# Patient Record
Sex: Male | Born: 1950 | Race: Black or African American | Hispanic: No | Marital: Married | Smoking: Current some day smoker
Health system: Southern US, Community
[De-identification: ages and names within clinical notes are randomized; demographics above are authoritative.]

## PROBLEM LIST (undated history)

## (undated) HISTORY — PX: OTHER SURGICAL HISTORY: SHX169

---

## 2005-03-14 ENCOUNTER — Emergency Department: Payer: Self-pay | Admitting: Emergency Medicine

## 2006-05-18 ENCOUNTER — Emergency Department: Payer: Self-pay | Admitting: Emergency Medicine

## 2009-09-03 ENCOUNTER — Emergency Department: Payer: Self-pay | Admitting: Internal Medicine

## 2019-12-25 ENCOUNTER — Emergency Department
Admission: EM | Admit: 2019-12-25 | Discharge: 2019-12-25 | Disposition: A | Discharge status: Home / Self Care | Payer: Medicare HMO | Attending: Emergency Medicine | Admitting: Emergency Medicine

## 2019-12-25 ENCOUNTER — Other Ambulatory Visit: Payer: Self-pay

## 2019-12-25 DIAGNOSIS — N390 Urinary tract infection, site not specified: Secondary | ICD-10-CM | POA: Diagnosis not present

## 2019-12-25 DIAGNOSIS — R300 Dysuria: Secondary | ICD-10-CM | POA: Diagnosis present

## 2019-12-25 DIAGNOSIS — R319 Hematuria, unspecified: Secondary | ICD-10-CM

## 2019-12-25 LAB — URINALYSIS, COMPLETE (UACMP) WITH MICROSCOPIC
Bacteria, UA: NONE SEEN
Bilirubin Urine: NEGATIVE
Glucose, UA: NEGATIVE mg/dL
Ketones, ur: NEGATIVE mg/dL
Nitrite: NEGATIVE
Protein, ur: >=300 mg/dL — AB
RBC / HPF: >50 RBC/hpf — ABNORMAL HIGH (ref 0–5)
Specific Gravity, Urine: 1.022 (ref 1.005–1.030)
Squamous Epithelial / LPF: NONE SEEN (ref 0–5)
WBC, UA: >50 WBC/hpf — ABNORMAL HIGH (ref 0–5)
pH: 6 (ref 5.0–8.0)

## 2019-12-25 MED ORDER — PHENAZOPYRIDINE HCL 200 MG PO TABS: 200.0000 mg | ORAL_TABLET | Freq: Three times a day (TID) | ORAL | 0 refills | Status: AC | PRN

## 2019-12-25 MED ORDER — CEPHALEXIN 500 MG PO CAPS: 500.0000 mg | ORAL_CAPSULE | Freq: Three times a day (TID) | ORAL | 0 refills | Status: DC

## 2019-12-25 MED ORDER — PHENAZOPYRIDINE HCL 200 MG PO TABS
200.0000 mg | ORAL_TABLET | Freq: Once | ORAL | Status: AC
  Administered 2019-12-25: 200 mg via ORAL
  Filled 2019-12-25: qty 1

## 2019-12-25 MED ORDER — CEPHALEXIN 500 MG PO CAPS
500.0000 mg | ORAL_CAPSULE | Freq: Once | ORAL | Status: AC
Start: 2019-12-25 — End: 2019-12-25
  Administered 2019-12-25: 500 mg via ORAL
  Filled 2019-12-25: qty 1

## 2019-12-25 NOTE — Discharge Instructions (Signed)
1.  Take antibiotic as prescribed (Keflex 500 mg 3 times daily x7 days). 2.  You may take medicine for the discomfort of urination as prescribed (Pyridium). 3.  Return to the ER for worsening symptoms, persistent vomiting, difficulty breathing or other concerns.

## 2019-12-25 NOTE — ED Provider Notes (Signed)
[facility] Emergency Department Provider Note   ____________________________________________   First MD Initiated Contact with Patient 12/25/19 [phone]     (approximate)  I have reviewed the triage vital signs and the nursing notes.   HISTORY  Chief Complaint burning with urination    HPI Edker Bergsten is a 69 y.o. male who presents to the ED from home with a chief complaint of dysuria.  Symptoms x2 days.  Denies associated fever, abdominal pain, nausea, vomiting, urethral discharge or STD concerns.        Past medical history None  There are no problems to display for this patient.    Prior to Admission medications   Medication Sig Start Date End Date Taking? Authorizing Provider  cephALEXin (KEFLEX) 500 MG capsule Take 1 capsule (500 mg total) by mouth 3 (three) times daily. 12/25/19   Paulette Blanch, MD  phenazopyridine (PYRIDIUM) 200 MG tablet Take 1 tablet (200 mg total) by mouth 3 (three) times daily as needed for pain. 12/25/19   Paulette Blanch, MD    Allergies Patient has no known allergies.  No family history on file.  Social History Social History   Tobacco Use  . Smoking status: Not on file  Substance Use Topics  . Alcohol use: Not on file  . Drug use: Not on file    Review of Systems  Constitutional: No fever/chills Eyes: No visual changes. ENT: No sore throat. Cardiovascular: Denies chest pain. Respiratory: Denies shortness of breath. Gastrointestinal: No abdominal pain.  No nausea, no vomiting.  No diarrhea.  No constipation. Genitourinary: Positive for dysuria. Musculoskeletal: Negative for back pain. Skin: Negative for rash. Neurological: Negative for headaches, focal weakness or numbness.   ____________________________________________   PHYSICAL EXAM:  VITAL SIGNS: ED Triage Vitals [12/25/19 0215]  Enc Vitals Group     BP (!) 190/97     Pulse Rate 100     Resp 16     Temp 97.9 F (36.6 C)     Temp Source  Oral     SpO2 100 %     Weight 200 lb (90.7 kg)     Height 5\' 8"  (1.727 m)     Head Circumference      Peak Flow      Pain Score 0     Pain Loc      Pain Edu?      Excl. in [place]?     Constitutional: Alert and oriented. Well appearing and in no acute distress. Eyes: Conjunctivae are normal. PERRL. EOMI. Head: Atraumatic. Nose: No congestion/rhinnorhea. Mouth/Throat: Mucous membranes are moist.   Neck: No stridor.   Cardiovascular: Normal rate, regular rhythm. Grossly normal heart sounds.  Good peripheral circulation. Respiratory: Normal respiratory effort.  No retractions. Lungs CTAB. Gastrointestinal: Soft and nontender to light or deep palpation. No distention. No abdominal bruits. No CVA tenderness. Musculoskeletal: No lower extremity tenderness nor edema.  No joint effusions. Neurologic:  Normal speech and language. No gross focal neurologic deficits are appreciated. No gait instability. Skin:  Skin is warm, dry and intact. No rash noted. Psychiatric: Mood and affect are normal. Speech and behavior are normal.  ____________________________________________   LABS (all labs ordered are listed, but only abnormal results are displayed)  Labs Reviewed  URINALYSIS, COMPLETE (UACMP) WITH MICROSCOPIC - Abnormal; Notable for the following components:      Result Value   Color, Urine YELLOW (*)    APPearance TURBID (*)    Hgb urine dipstick  MODERATE (*)    Protein, ur >=300 (*)    Leukocytes,Ua LARGE (*)    RBC / HPF >50 (*)    WBC, UA >50 (*)    All other components within normal limits  URINE CULTURE   ____________________________________________  EKG  None ____________________________________________  RADIOLOGY  ED MD interpretation: None  Official radiology report(s): No results found.  ____________________________________________   PROCEDURES  Procedure(s) performed (including Critical  Care):  Procedures   ____________________________________________   INITIAL IMPRESSION / ASSESSMENT AND PLAN / ED COURSE  As part of my medical decision making, I reviewed the following data within the [facility] notes reviewed and incorporated, Labs reviewed and Notes from prior ED visits     Sergi Pickron was evaluated in Emergency Department on 12/25/2019 for the symptoms described in the history of present illness. He was evaluated in the context of the global COVID-19 pandemic, which necessitated consideration that the patient might be at risk for infection with the SARS-CoV-2 virus that causes COVID-19. Institutional protocols and algorithms that pertain to the evaluation of patients at risk for COVID-19 are in a state of rapid change based on information released by regulatory bodies including the CDC and federal and state organizations. These policies and algorithms were followed during the patient's care in the ED.    69 year old male presenting with dysuria.  Urinalysis consistent with UTI.  Will culture urine, placed on Keflex and Pyridium and patient will follow up with his PCP.  Strict return precautions given.  Patient verbalizes understanding and agrees with plan of care.      ____________________________________________   FINAL CLINICAL IMPRESSION(S) / ED DIAGNOSES  Final diagnoses:  Urinary tract infection with hematuria, site unspecified     ED Discharge Orders         Ordered    cephALEXin (KEFLEX) 500 MG capsule  3 times daily     12/25/19 0401    phenazopyridine (PYRIDIUM) 200 MG tablet  3 times daily PRN     12/25/19 0401           Note:  This document was prepared using Dragon voice recognition software and may include unintentional dictation errors.   Paulette Blanch, MD 12/25/19 [phone]

## 2019-12-25 NOTE — ED Triage Notes (Signed)
Pt states two days of burning with urination. Pt states he believes he may have blood in urine. Pt denies fever, or other pain.

## 2019-12-27 LAB — URINE CULTURE: Culture: >=100000 — AB

## 2020-02-28 ENCOUNTER — Emergency Department: Payer: Medicare HMO

## 2020-02-28 ENCOUNTER — Other Ambulatory Visit: Payer: Self-pay

## 2020-02-28 ENCOUNTER — Emergency Department
Admission: EM | Admit: 2020-02-28 | Discharge: 2020-02-28 | Disposition: A | Discharge status: Home / Self Care | Payer: Medicare HMO | Attending: Emergency Medicine | Admitting: Emergency Medicine

## 2020-02-28 ENCOUNTER — Encounter: Payer: Self-pay | Admitting: Emergency Medicine

## 2020-02-28 DIAGNOSIS — M47816 Spondylosis without myelopathy or radiculopathy, lumbar region: Secondary | ICD-10-CM | POA: Insufficient documentation

## 2020-02-28 DIAGNOSIS — K5730 Diverticulosis of large intestine without perforation or abscess without bleeding: Secondary | ICD-10-CM | POA: Diagnosis not present

## 2020-02-28 DIAGNOSIS — R1032 Left lower quadrant pain: Secondary | ICD-10-CM | POA: Diagnosis present

## 2020-02-28 DIAGNOSIS — N410 Acute prostatitis: Secondary | ICD-10-CM

## 2020-02-28 DIAGNOSIS — K4020 Bilateral inguinal hernia, without obstruction or gangrene, not specified as recurrent: Secondary | ICD-10-CM | POA: Insufficient documentation

## 2020-02-28 DIAGNOSIS — I700 Atherosclerosis of aorta: Secondary | ICD-10-CM | POA: Insufficient documentation

## 2020-02-28 DIAGNOSIS — D3502 Benign neoplasm of left adrenal gland: Secondary | ICD-10-CM | POA: Diagnosis not present

## 2020-02-28 DIAGNOSIS — N50812 Left testicular pain: Secondary | ICD-10-CM

## 2020-02-28 DIAGNOSIS — F17210 Nicotine dependence, cigarettes, uncomplicated: Secondary | ICD-10-CM | POA: Insufficient documentation

## 2020-02-28 DIAGNOSIS — N400 Enlarged prostate without lower urinary tract symptoms: Secondary | ICD-10-CM | POA: Diagnosis not present

## 2020-02-28 DIAGNOSIS — N5089 Other specified disorders of the male genital organs: Secondary | ICD-10-CM

## 2020-02-28 LAB — COMPREHENSIVE METABOLIC PANEL
ALT: 16 U/L (ref 0–44)
AST: 17 U/L (ref 15–41)
Albumin: 3.6 g/dL (ref 3.5–5.0)
Alkaline Phosphatase: 88 U/L (ref 38–126)
Anion gap: 10 (ref 5–15)
BUN: 14 mg/dL (ref 8–23)
CO2: 25 mmol/L (ref 22–32)
Calcium: 8.7 mg/dL — ABNORMAL LOW (ref 8.9–10.3)
Chloride: 104 mmol/L (ref 98–111)
Creatinine, Ser: 0.87 mg/dL (ref 0.61–1.24)
GFR calc Af Amer: >60 mL/min (ref >60)
GFR calc non Af Amer: >60 mL/min (ref >60)
Glucose, Bld: 101 mg/dL — ABNORMAL HIGH (ref 70–99)
Potassium: 3.9 mmol/L (ref 3.5–5.1)
Sodium: 139 mmol/L (ref 135–145)
Total Bilirubin: 1.1 mg/dL (ref 0.3–1.2)
Total Protein: 7.5 g/dL (ref 6.5–8.1)

## 2020-02-28 LAB — URINALYSIS, COMPLETE (UACMP) WITH MICROSCOPIC
Bacteria, UA: NONE SEEN
Bilirubin Urine: NEGATIVE
Glucose, UA: NEGATIVE mg/dL
Ketones, ur: NEGATIVE mg/dL
Nitrite: NEGATIVE
Protein, ur: NEGATIVE mg/dL
Specific Gravity, Urine: 1.016 (ref 1.005–1.030)
WBC, UA: >50 WBC/hpf — ABNORMAL HIGH (ref 0–5)
pH: 5 (ref 5.0–8.0)

## 2020-02-28 LAB — CBC
HCT: 40 % (ref 39.0–52.0)
Hemoglobin: 14.1 g/dL (ref 13.0–17.0)
MCH: 30.9 pg (ref 26.0–34.0)
MCHC: 35.3 g/dL (ref 30.0–36.0)
MCV: 87.5 fL (ref 80.0–100.0)
Platelets: 292 10*3/uL (ref 150–400)
RBC: 4.57 MIL/uL (ref 4.22–5.81)
RDW: 15.9 % — ABNORMAL HIGH (ref 11.5–15.5)
WBC: 14 10*3/uL — ABNORMAL HIGH (ref 4.0–10.5)
nRBC: 0 % (ref 0.0–0.2)

## 2020-02-28 LAB — LIPASE, BLOOD: Lipase: 22 U/L (ref 11–51)

## 2020-02-28 MED ORDER — SULFAMETHOXAZOLE-TRIMETHOPRIM 800-160 MG PO TABS
1.0000 | ORAL_TABLET | Freq: Two times a day (BID) | ORAL | 0 refills | Status: DC
Start: 2020-02-28 — End: 2020-02-28

## 2020-02-28 MED ORDER — SULFAMETHOXAZOLE-TRIMETHOPRIM 800-160 MG PO TABS: 1.0000 | ORAL_TABLET | Freq: Two times a day (BID) | ORAL | 0 refills | Status: AC

## 2020-02-28 MED ORDER — SODIUM CHLORIDE 0.9% FLUSH: 3.0000 mL | Freq: Once | INTRAVENOUS | Status: DC

## 2020-02-28 NOTE — ED Provider Notes (Signed)
[facility] Emergency Department Provider Note  ____________________________________________  Time seen: Approximately 12:21 PM  I have reviewed the triage vital signs and the nursing notes.   HISTORY  Chief Complaint Abdominal Pain    HPI Mark Olliff. is a 69 y.o. male with no significant past medical history who comes ED complaining of left lower quadrant abdominal pain radiating to the left testicle for the past 3 days.  Constant, waxing and waning, no aggravating or alleviating factors.  Denies dysuria frequency urgency hematuria or penile discharge.  Denies sexual activity or possibility of STI.  No vomiting or diarrhea.  No history of kidney stones.  Denies fever.      History reviewed. No pertinent past medical history.   There are no problems to display for this patient.    History reviewed. No pertinent surgical history.   Prior to Admission medications   Medication Sig Start Date End Date Taking? Authorizing Provider  cephALEXin (KEFLEX) 500 MG capsule Take 1 capsule (500 mg total) by mouth 3 (three) times daily. 12/25/19   Paulette Blanch, MD  phenazopyridine (PYRIDIUM) 200 MG tablet Take 1 tablet (200 mg total) by mouth 3 (three) times daily as needed for pain. 12/25/19   Paulette Blanch, MD  sulfamethoxazole-trimethoprim (BACTRIM DS) 800-160 MG tablet Take 1 tablet by mouth 2 (two) times daily for 14 days. 02/28/20 03/13/20  Carrie Mew, MD     Allergies Patient has no known allergies.   History reviewed. No pertinent family history.  Social History Social History   Tobacco Use  . Smoking status: Current Some Day Smoker  . Smokeless tobacco: Never Used  Substance Use Topics  . Alcohol use: Not on file  . Drug use: Not on file    Review of Systems  Constitutional:   No fever or chills.  ENT:   No sore throat. No rhinorrhea. Cardiovascular:   No chest pain or syncope. Respiratory:   No dyspnea or cough. Gastrointestinal:    Positive left lower quadrant pain as above without vomiting and diarrhea.  Musculoskeletal:   Negative for focal pain or swelling All other systems reviewed and are negative except as documented above in ROS and HPI.  ____________________________________________   PHYSICAL EXAM:  VITAL SIGNS: ED Triage Vitals  Enc Vitals Group     BP 02/28/20 0754 (!) 175/95     Pulse Rate 02/28/20 0754 92     Resp 02/28/20 0754 18     Temp 02/28/20 0754 97.6 F (36.4 C)     Temp Source 02/28/20 0754 Oral     SpO2 02/28/20 0754 96 %     Weight 02/28/20 0755 240 lb (108.9 kg)     Height 02/28/20 0755 5\' 8"  (1.727 m)     Head Circumference --      Peak Flow --      Pain Score 02/28/20 0754 6     Pain Loc --      Pain Edu? --      Excl. in [place]? --     Vital signs reviewed, nursing assessments reviewed.   Constitutional:   Alert and oriented. Non-toxic appearance. Eyes:   Conjunctivae are normal. EOMI. PERRL. ENT      Head:   Normocephalic and atraumatic.      Nose:   Wearing a mask.      Mouth/Throat:   Wearing a mask.      Neck:   No meningismus. Full ROM. Hematological/Lymphatic/Immunilogical:   No  cervical lymphadenopathy. Cardiovascular:   RRR. Symmetric bilateral radial and DP pulses.  No murmurs. Cap refill less than 2 seconds. Respiratory:   Normal respiratory effort without tachypnea/retractions. Breath sounds are clear and equal bilaterally. No wheezes/rales/rhonchi. Gastrointestinal:   Soft and nontender. Non distended. There is no CVA tenderness.  No rebound, rigidity, or guarding. Genitourinary:   Left scrotal swelling erythema and tenderness.  No crepitus.  No hernia Musculoskeletal:   Normal range of motion in all extremities. No joint effusions.  No lower extremity tenderness.  No edema. Neurologic:   Normal speech and language.  Motor grossly intact. No acute focal neurologic deficits are appreciated.  Skin:    Skin is warm, dry and intact. No rash noted.  No petechiae,  purpura, or bullae.  ____________________________________________    LABS (pertinent positives/negatives) (all labs ordered are listed, but only abnormal results are displayed) Labs Reviewed  COMPREHENSIVE METABOLIC PANEL - Abnormal; Notable for the following components:      Result Value   Glucose, Bld 101 (*)    Calcium 8.7 (*)    All other components within normal limits  CBC - Abnormal; Notable for the following components:   WBC 14.0 (*)    RDW 15.9 (*)    All other components within normal limits  URINALYSIS, COMPLETE (UACMP) WITH MICROSCOPIC - Abnormal; Notable for the following components:   Color, Urine YELLOW (*)    APPearance CLEAR (*)    Hgb urine dipstick SMALL (*)    Leukocytes,Ua LARGE (*)    WBC, UA >50 (*)    All other components within normal limits  LIPASE, BLOOD   ____________________________________________   EKG    ____________________________________________    RADIOLOGY  CT Renal Stone Study  Result Date: 02/28/2020 CLINICAL DATA:  Flank pain. Suspect kidney stone. LEFT LOWER QUADRANT pain radiates to LEFT groin. Decreased urination for 2 days. No previous studies. EXAM: CT ABDOMEN AND PELVIS WITHOUT CONTRAST TECHNIQUE: Multidetector CT imaging of the abdomen and pelvis was performed following the standard protocol without IV contrast. COMPARISON:  None. FINDINGS: Lower chest: There is focal airspace filling opacity at the MEDIAL RIGHT lung base, consistent minimal infiltrate. LEFT lung base is unremarkable. There is elevation of LEFT hemidiaphragm. Heart size is normal. Hepatobiliary: No focal liver abnormality is seen. No radiopaque gallstones, biliary dilatation, or pericholecystic inflammatory changes. Pancreas: Unremarkable. No pancreatic ductal dilatation or surrounding inflammatory changes. Spleen: Normal in size without focal abnormality. Adrenals/Urinary Tract: A LEFT adrenal adenoma is 2.5 x 2.6 centimeters. RIGHT adrenal gland is normal in  appearance. Kidneys are unremarkable. Ureters are unremarkable. The bladder and visualized portion of the urethra are normal. Stomach/Bowel: The stomach is unremarkable. Small bowel loops are normal in caliber and wall thickness. The appendix is well seen and has a normal appearance. Moderate stool burden. There is significant diverticulosis of the descending and sigmoid colon. No acute inflammatory changes. Vascular/Lymphatic: There is focal atherosclerotic calcification of the abdominal aorta, not associated with aneurysm. No retroperitoneal or mesenteric adenopathy. Reproductive: The prostate gland appears enlarged. Inflammatory changes are identified in the fat surrounding the prostate and seminal vesicles. No evidence for fluid collection or contour deforming mass. Findings are favored to represent acute prostatitis. Other: Bilateral fat containing inguinal hernias are present. Anterior abdominal wall is unremarkable. No ascites. Musculoskeletal: Extensive degenerative changes in the LOWER lumbar spine. Coarse trabeculae and increased sclerosis identified in the LEFT iliac wing and LEFT pubis. There are degenerative changes in the LEFT hip. IMPRESSION: 1. Enlarged  prostate gland with inflammatory changes in the fat surrounding the prostate and seminal vesicles. Findings are favored to represent prostatitis. No evidence for fluid collection or contour deforming mass. 2. No urinary tract stones. 3. LEFT adrenal adenoma. 4. Colonic diverticulosis without acute inflammatory changes. 5. Bilateral fat containing inguinal hernias. 6. Degenerative changes in the LOWER lumbar spine and LEFT hip. 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 7. Aortic Atherosclerosis (ICD10-I70.0). Electronically Signed   By: Nolon Nations M.D.   On: 02/28/2020 14:52   US SCROTUM W/DOPPLER  Result Date: 02/28/2020 CLINICAL DATA:  Left scrotal edema and tenderness for 3 days EXAM: SCROTAL ULTRASOUND DOPPLER ULTRASOUND OF THE TESTICLES  TECHNIQUE: Complete ultrasound examination of the testicles, epididymis, and other scrotal structures was performed. Color and spectral Doppler ultrasound were also utilized to evaluate blood flow to the testicles. COMPARISON:  None. FINDINGS: Right testicle Measurements: 3.6 x 2.3 x 2.5 cm. No mass or microlithiasis visualized. Left testicle Measurements:  3.6 x 2.1 cm.  No mass or microlithiasis visualized. Right epididymis:  There is a 0.9 cm cyst in the right epididymis. Left epididymis:  Normal in size and appearance. Hydrocele:  Small bilateral hydroceles are noted. Varicocele:  None visualized. Pulsed Doppler interrogation of both testes demonstrates normal low resistance arterial and venous waveforms bilaterally. IMPRESSION: No evidence of testicular torsion. No evidence of orchitis. Small bilateral hydroceles. Electronically Signed   By: Abelardo Diesel M.D.   On: 02/28/2020 13:18    ____________________________________________   PROCEDURES Procedures  ____________________________________________  DIFFERENTIAL DIAGNOSIS   Diverticulitis, kidney stone, scrotal cellulitis, epididymitis  CLINICAL IMPRESSION / ASSESSMENT AND PLAN / ED COURSE  Medications ordered in the ED: Medications  sodium chloride flush (NS) 0.9 % injection 3 mL (has no administration in time range)    Pertinent labs & imaging results that were available during my care of the patient were reviewed by me and considered in my medical decision making (see chart for details).  Mark Shawn. was evaluated in Emergency Department on 02/28/2020 for the symptoms described in the history of present illness. He was evaluated in the context of the global COVID-19 pandemic, which necessitated consideration that the patient might be at risk for infection with the SARS-CoV-2 virus that causes COVID-19. Institutional protocols and algorithms that pertain to the evaluation of patients at risk for COVID-19 are in a state of rapid  change based on information released by regulatory bodies including the CDC and federal and state organizations. These policies and algorithms were followed during the patient's care in the ED.   Patient presents with left lower quadrant pain, scrotal swelling.  Doubt torsion.  Most likely epididymitis or prostatitis.  UA does show large leukoesterase and WBCs, no bacteria.  Will obtain scrotal ultrasound.  Plan to start antibiotics.   ----------------------------------------- 3:19 PM on 02/28/2020 -----------------------------------------  Scrotal ultrasound unremarkable.  CT renal stone protocol obtained which shows prostatitis, no obstructing stone or other intra-abdominal pathology.  Stable for discharge home on Bactrim, follow-up with urology.     ____________________________________________   FINAL CLINICAL IMPRESSION(S) / ED DIAGNOSES    Final diagnoses:  Acute prostatitis     ED Discharge Orders         Ordered    sulfamethoxazole-trimethoprim (BACTRIM DS) 800-160 MG tablet  2 times daily,   Status:  Discontinued     Reprint     02/28/20 1518    sulfamethoxazole-trimethoprim (BACTRIM DS) 800-160 MG tablet  2 times daily     Discontinue  Reprint     02/28/20 1519          Portions of this note were generated with dragon dictation software. Dictation errors may occur despite best attempts at proofreading.   Carrie Mew, MD 02/28/20 [phone]

## 2020-02-28 NOTE — ED Notes (Signed)
E-signature not working at this time. Pt verbalized understanding of D/C instructions, prescriptions and follow up care with no further questions at this time. Pt in NAD and ambulatory at time of D/C.  

## 2020-02-28 NOTE — ED Triage Notes (Signed)
PT to ER with c/o LLQ abdominal pain for last 3 days.  PT denies n/v/d, states he helped a family member moved and pain started several days after that.  States he feels like it is swollen in the area of pain.  No urinary symptoms.

## 2020-03-06 NOTE — Progress Notes (Signed)
03/07/2020 3:15 PM   Mark Snyder. 01/07/1951 [id number]  Referring provider: No referring provider defined for this encounter. Chief Complaint  Patient presents with  . New Patient (Initial Visit)    ER follow up    HPI: Mark Snyder. is a 69 y.o. male who is seen today for an evaluation and management of acute prostatitis and non obstructing stone.  He was seen in the ED in 12/2019 and diagnosed with a UTI. UA showed moderate hematuria, WBC >50, RBC >50. Associated urine culture indicative of E. Coli resistant to ampicillin and intermediate to ampicillin/sulbactam.  He was presented to the ED with abdominal pain on 02/28/2020. He had left lower quadrant abdominal pain radiating to the left testicle x 3 days. Pain was constant, waxing and waning, no aggravating or alleviating factors.  Denied dysuria frequency urgency hematuria or penile discharge. No vomiting or diarrhea.  No history of kidney stones. Patient discharged on bactrim.   UA showed microscopic hematuria, WBC >50, RBC 6-10.   Scrotum ultrasound showed no evidence of testicular torsion. No evidence of orchitis. Small bilateral hydroceles.  CT renal stone study showed enlarged prostate gland with inflammatory changes in the fat surrounding the prostate and seminal vesicles. Findings were favored to represent prostatitis. No evidence for fluid collection or contour deforming mass. No urinary tract stones. LEFT adrenal adenoma.  PVR is 17 mL.   Overall, he reports today that he seems to be improving.  His urinary symptoms are abating but have not fully resolved.Marland Kitchen  His testicular pain is resolved.  He has no hematuria. Denies dysuria.       He has no primary care physician and has "no" medical problems.  Blood pressure today is 188/73.    IPSS    Row Name 03/07/20 1500         International Prostate Symptom Score   How often have you had the sensation of not emptying your bladder? Almost always     How often  have you had to urinate less than every two hours? More than half the time     How often have you found you stopped and started again several times when you urinated? Not at All     How often have you found it difficult to postpone urination? About half the time     How often have you had a weak urinary stream? Not at All     How often have you had to strain to start urination? Not at All     How many times did you typically get up at night to urinate? 3 Times     Total IPSS Score 15       Quality of Life due to urinary symptoms   If you were to spend the rest of your life with your urinary condition just the way it is now how would you feel about that? Mostly Satisfied            Score: 15 1-7 Mild 8-19 Moderate 20-35 Severe     PMH: History reviewed. No pertinent past medical history.  Surgical History: Past Surgical History:  Procedure Laterality Date  . none      Home Medications:  Allergies as of 03/07/2020   No Known Allergies     Medication List       Accurate as of March 07, 2020 11:59 PM. If you have any questions, ask your nurse or doctor.        STOP  taking these medications   cephALEXin 500 MG capsule Commonly known as: KEFLEX Stopped by: Hollice Espy, MD     TAKE these medications   phenazopyridine 200 MG tablet Commonly known as: Pyridium Take 1 tablet (200 mg total) by mouth 3 (three) times daily as needed for pain.   sulfamethoxazole-trimethoprim 800-160 MG tablet Commonly known as: Bactrim DS Take 1 tablet by mouth 2 (two) times daily for 14 days.       Allergies: No Known Allergies  Family History: Family History  Problem Relation Age of Onset  . Bladder Cancer Neg Hx   . Kidney cancer Neg Hx   . Prostate cancer Neg Hx     Social History:  reports that he has been smoking. He has never used smokeless tobacco. No history on file for alcohol use and drug use. Lives in [place], [place].    Physical Exam: BP (!) 188/73   Pulse  76   Ht 5\' 8"  (1.727 m)   Wt 216 lb (98 kg)   BMI 32.84 kg/m   Constitutional:  Alert and oriented, No acute distress. HEENT: [place] AT, moist mucus membranes.  Trachea midline, no masses. Cardiovascular: No clubbing, cyanosis, or edema. Respiratory: Normal respiratory effort, no increased work of breathing. Skin: No rashes, bruises or suspicious lesions. Neurologic: Grossly intact, no focal deficits, moving all 4 extremities. Psychiatric: Normal mood and affect.  Laboratory Data:  Lab Results  Component Value Date   CREATININE 0.87 02/28/2020    Urinalysis normal  Pertinent Imaging: Results for orders placed or performed in visit on 03/07/20  Microscopic Examination   Urine  Result Value Ref Range   WBC, UA 0-5 0 - 5 /hpf   RBC 0-2 0 - 2 /hpf   Epithelial Cells (non renal) 0-10 0 - 10 /hpf   Bacteria, UA None seen None seen/Few  Urinalysis, Complete  Result Value Ref Range   Specific Gravity, UA 1.025 1.005 - 1.030   pH, UA 5.0 5.0 - 7.5   Color, UA Yellow Yellow   Appearance Ur Clear Clear   Leukocytes,UA Trace (A) Negative   Protein,UA Negative Negative/Trace   Glucose, UA Negative Negative   Ketones, UA Negative Negative   RBC, UA Negative Negative   Bilirubin, UA Negative Negative   Urobilinogen, Ur 0.2 0.2 - 1.0 mg/dL   Nitrite, UA Negative Negative   Microscopic Examination See below:   BLADDER SCAN AMB NON-IMAGING  Result Value Ref Range   Scan Result 31ml      Results for orders placed during the hospital encounter of 02/28/20  CT Renal Stone Study  Narrative CLINICAL DATA:  Flank pain. Suspect kidney stone. LEFT LOWER QUADRANT pain radiates to LEFT groin. Decreased urination for 2 days. No previous studies.  EXAM: CT ABDOMEN AND PELVIS WITHOUT CONTRAST  TECHNIQUE: Multidetector CT imaging of the abdomen and pelvis was performed following the standard protocol without IV contrast.  COMPARISON:  None.  FINDINGS: Lower chest: There is focal  airspace filling opacity at the MEDIAL RIGHT lung base, consistent minimal infiltrate. LEFT lung base is unremarkable. There is elevation of LEFT hemidiaphragm. Heart size is normal.  Hepatobiliary: No focal liver abnormality is seen. No radiopaque gallstones, biliary dilatation, or pericholecystic inflammatory changes.  Pancreas: Unremarkable. No pancreatic ductal dilatation or surrounding inflammatory changes.  Spleen: Normal in size without focal abnormality.  Adrenals/Urinary Tract: A LEFT adrenal adenoma is 2.5 x 2.6 centimeters. RIGHT adrenal gland is normal in appearance. Kidneys are unremarkable. Ureters are unremarkable.  The bladder and visualized portion of the urethra are normal.  Stomach/Bowel: The stomach is unremarkable. Small bowel loops are normal in caliber and wall thickness. The appendix is well seen and has a normal appearance. Moderate stool burden. There is significant diverticulosis of the descending and sigmoid colon. No acute inflammatory changes.  Vascular/Lymphatic: There is focal atherosclerotic calcification of the abdominal aorta, not associated with aneurysm. No retroperitoneal or mesenteric adenopathy.  Reproductive: The prostate gland appears enlarged. Inflammatory changes are identified in the fat surrounding the prostate and seminal vesicles. No evidence for fluid collection or contour deforming mass. Findings are favored to represent acute prostatitis.  Other: Bilateral fat containing inguinal hernias are present. Anterior abdominal wall is unremarkable. No ascites.  Musculoskeletal: Extensive degenerative changes in the LOWER lumbar spine. Coarse trabeculae and increased sclerosis identified in the LEFT iliac wing and LEFT pubis. There are degenerative changes in the LEFT hip.  IMPRESSION: 1. Enlarged prostate gland with inflammatory changes in the fat surrounding the prostate and seminal vesicles. Findings are favored to represent  prostatitis. No evidence for fluid collection or contour deforming mass. 2. No urinary tract stones. 3. LEFT adrenal adenoma. 4. Colonic diverticulosis without acute inflammatory changes. 5. Bilateral fat containing inguinal hernias. 6. Degenerative changes in the LOWER lumbar spine and LEFT hip. 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 7. Aortic Atherosclerosis (ICD10-I70.0).   Electronically Signed By: Nolon Nations M.D. On: 02/28/2020 14:52   I have personally reviewed the images and agree with radiologist interpretation.    Assessment & Plan:    1. Left adrenal adenoma  [facility] imaging show stable in size since 2005. Will defer from functional work up. Suspect benign etiology given lack of change   2. Acute prostatitis/recurrent UTIs Patient has had several infections over the last few months.  UA was normal, will recheck after symptoms improve.  Discussed prostate cancer screening   Patient is emptying bladder well today  Continue complete antibiotics  Plan for symptom recheck in 3 months after infection is resolved, may have underlying BPH issues given to serial infections     Due to lack of PCP health screenings, I encouraged the patient to have a PCP and helped refer him to a new provider at [facility] [place], [place].    Follow up in 3 months for DRE, PSA, IPSS, PVR  Return in about 3 months (around 06/07/2020) for 51mo w/PSA, DRE, PVR IPSS.  [facility] [address], [place], [place] [address] [phone]  I, Selena Batten, am acting as a scribe for Dr. Hollice Espy.  I have reviewed the above documentation for accuracy and completeness, and I agree with the above.   Hollice Espy, MD  I spent 45 total minutes on the day of the encounter including pre-visit review of the medical record, face-to-face time with the patient, and post visit ordering of labs/imaging/tests.

## 2020-03-07 ENCOUNTER — Encounter: Payer: Self-pay | Admitting: Urology

## 2020-03-07 ENCOUNTER — Ambulatory Visit (INDEPENDENT_AMBULATORY_CARE_PROVIDER_SITE_OTHER): Payer: Medicare HMO | Admitting: Urology

## 2020-03-07 ENCOUNTER — Other Ambulatory Visit: Payer: Self-pay

## 2020-03-07 VITALS — BP 188/73 | HR 76 | Ht 68.0 in | Wt 216.0 lb

## 2020-03-07 DIAGNOSIS — N410 Acute prostatitis: Secondary | ICD-10-CM | POA: Diagnosis not present

## 2020-03-07 DIAGNOSIS — D3502 Benign neoplasm of left adrenal gland: Secondary | ICD-10-CM | POA: Diagnosis not present

## 2020-03-07 LAB — BLADDER SCAN AMB NON-IMAGING

## 2020-03-07 NOTE — Patient Instructions (Signed)
Prostate Cancer  The prostate is a male gland that helps make semen. Prostate cancer is when abnormal cells grow in this gland. Follow these instructions at home:  Take over-the-counter and prescription medicines only as told by your doctor.  Eat a healthy diet.  Get plenty of sleep.  Ask your doctor for help to find a support group for men with prostate cancer.  Keep all follow-up visits as told by your doctor. This is important.  If you have to go to the hospital, let your cancer doctor (oncologist) know.  Touch, hold, hug, and caress your partner to continue to show sexual feelings. Contact a doctor if:  You have trouble peeing (urinating).  You have blood in your pee (urine).  You have pain in your hips, back, or chest. Get help right away if:  You have weakness in your legs.  You lose feeling (have numbness) in your legs.  You cannot control your pee or your poop (stool).  You have trouble breathing.  You have sudden pain in your chest.  You have chills or a fever. Summary  The prostate is a male gland that helps make semen. Prostate cancer is when abnormal cells grow in this gland.  Ask your doctor for help to find a support group for men with prostate cancer.  Contact a doctor if you have problems peeing or have any new pain that you did not have before. This information is not intended to replace advice given to you by your health care provider. Make sure you discuss any questions you have with your health care provider. Document Revised: 07/25/2017 Document Reviewed: 04/22/2016 Elsevier Patient Education  2020 Elsevier Inc.  

## 2020-03-08 LAB — MICROSCOPIC EXAMINATION: Bacteria, UA: NONE SEEN

## 2020-03-08 LAB — URINALYSIS, COMPLETE
Bilirubin, UA: NEGATIVE
Glucose, UA: NEGATIVE
Ketones, UA: NEGATIVE
Nitrite, UA: NEGATIVE
Protein,UA: NEGATIVE
RBC, UA: NEGATIVE
Specific Gravity, UA: 1.025 (ref 1.005–1.030)
Urobilinogen, Ur: 0.2 mg/dL (ref 0.2–1.0)
pH, UA: 5 (ref 5.0–7.5)

## 2020-06-06 ENCOUNTER — Ambulatory Visit: Payer: Medicare HMO | Admitting: Physician Assistant

## 2022-03-12 IMAGING — CT CT RENAL STONE PROTOCOL
2 of 4 series · 15 of 46 positions shown, 17 images · non-contrast
Comparison: None.

CLINICAL DATA: Flank pain. Suspect kidney stone. LEFT LOWER
QUADRANT pain radiates to LEFT groin. Decreased urination for 2
days. No previous studies.

EXAM:
CT ABDOMEN AND PELVIS WITHOUT CONTRAST
TECHNIQUE: Multidetector CT imaging of the abdomen and pelvis was performed
following the standard protocol without IV contrast.

[Series 2: stone full standard · axial · 0.77mm/px · z∈[-877,-447]mm · 12 of 96 slices shown, 14 images]
[im 5/96  soft-tissue]
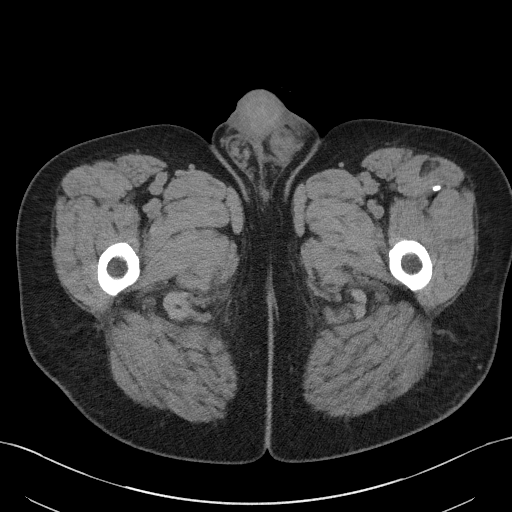
[im 5/96  bone]
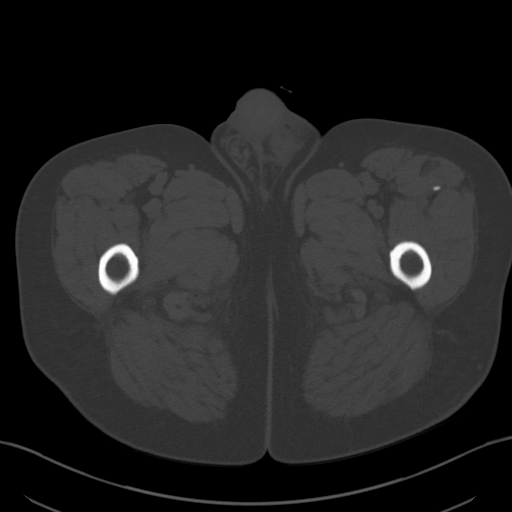
[im 13/96  soft-tissue]
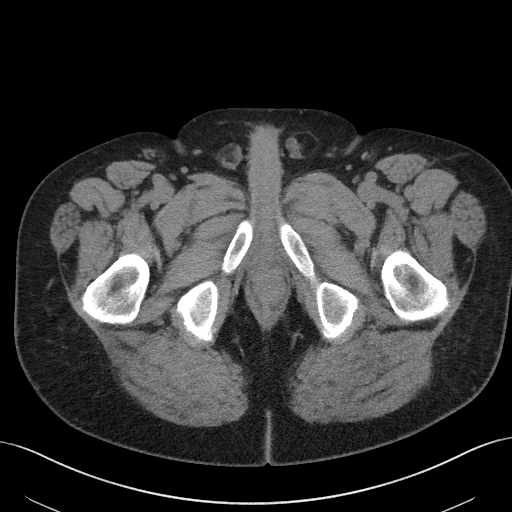
[im 22/96  soft-tissue]
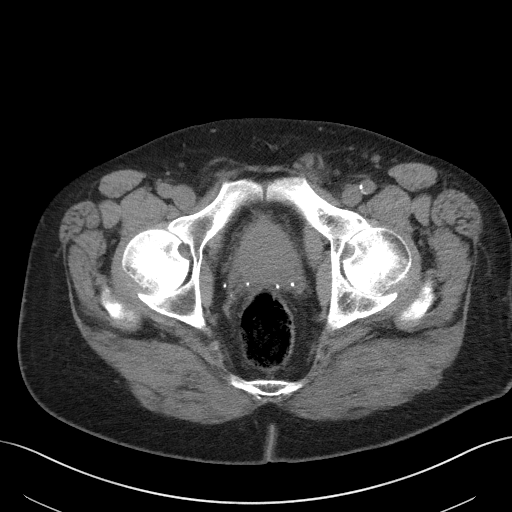
[im 31/96  soft-tissue]
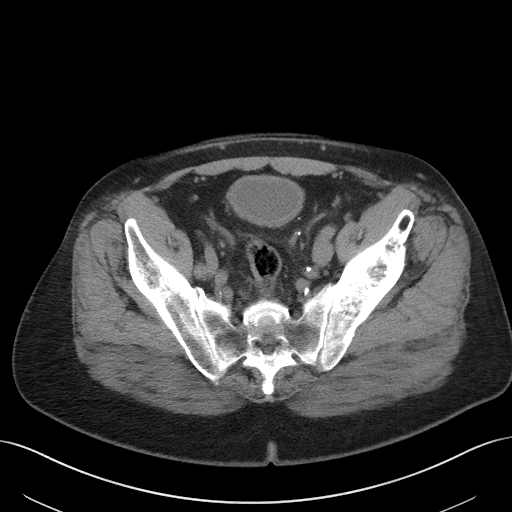
[im 35/96  soft-tissue]
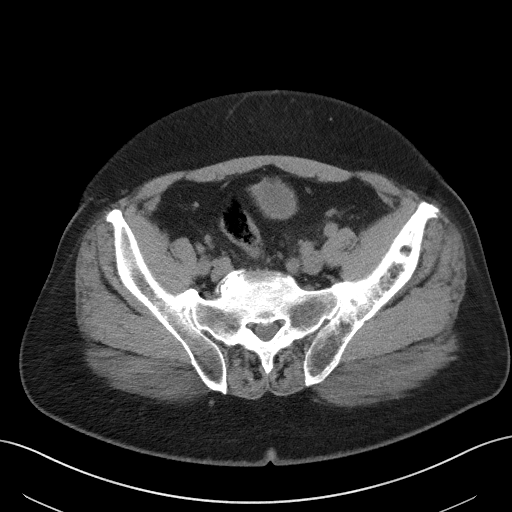
[im 44/96  soft-tissue]
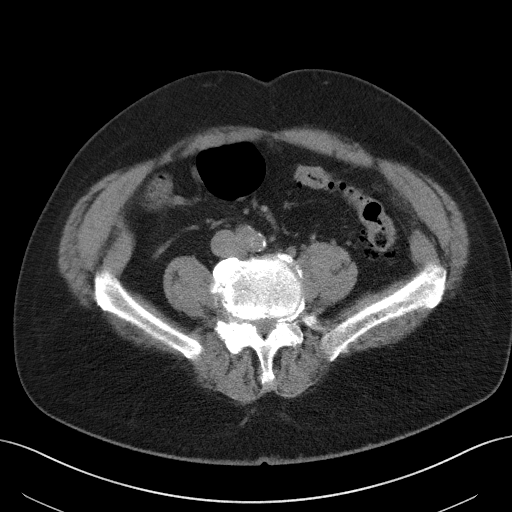
[im 52/96  soft-tissue]
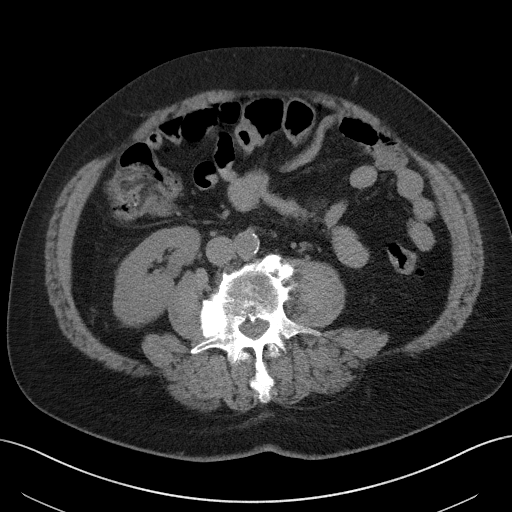
[im 61/96  soft-tissue]
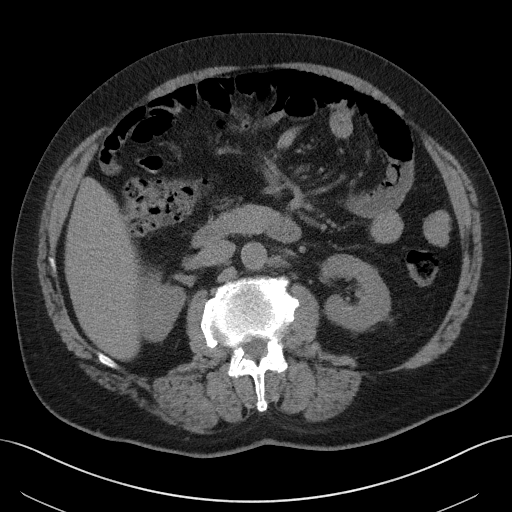
[im 65/96  soft-tissue]
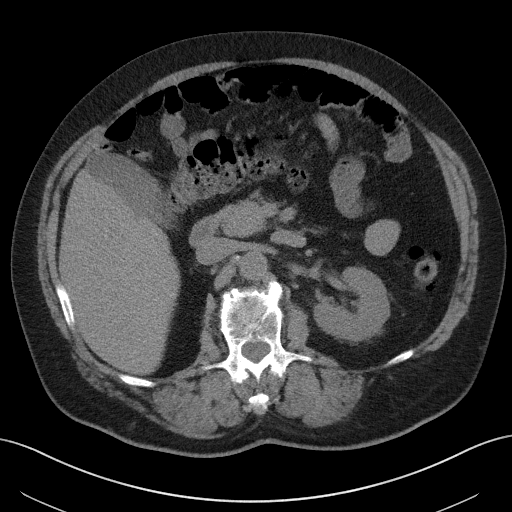
[im 65/96  bone]
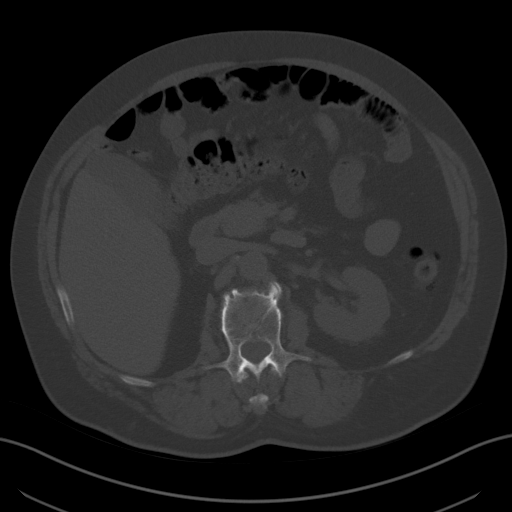
[im 74/96  soft-tissue]
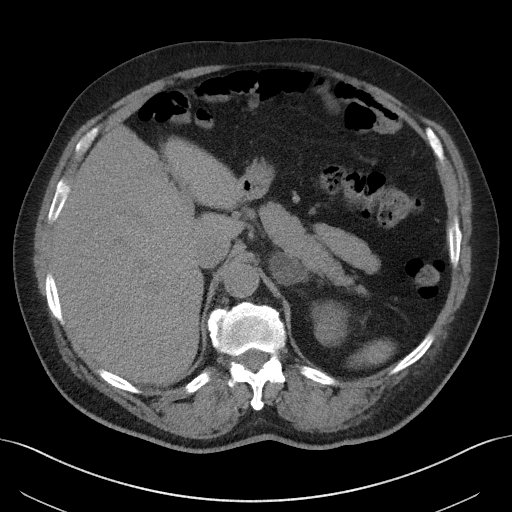
[im 83/96  soft-tissue]
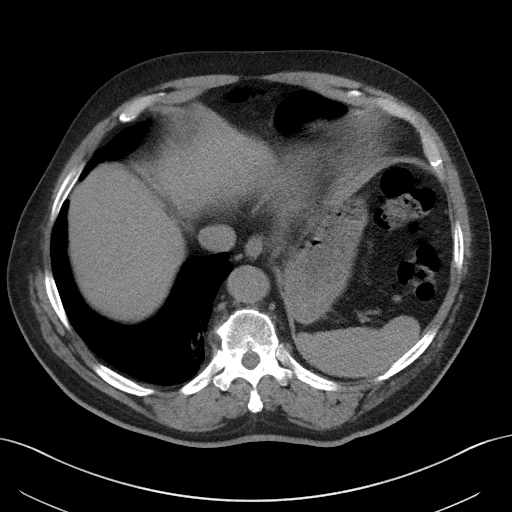
[im 91/96  soft-tissue]
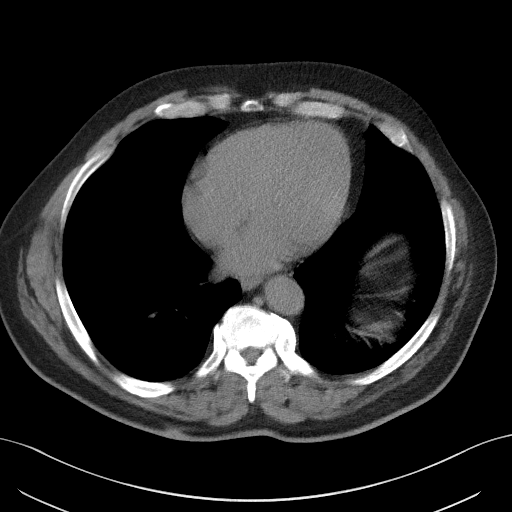

[Series 6: coronal · coronal · 0.74mm/px · 3 of 170 slices shown]
[im 57/170  soft-tissue]
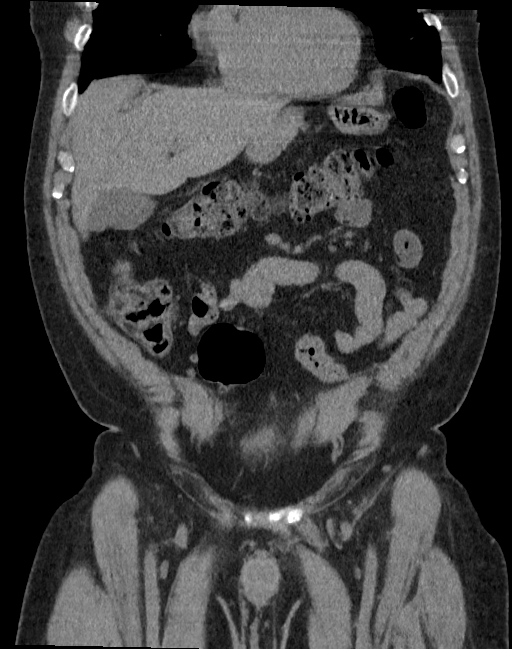
[im 76/170  soft-tissue]
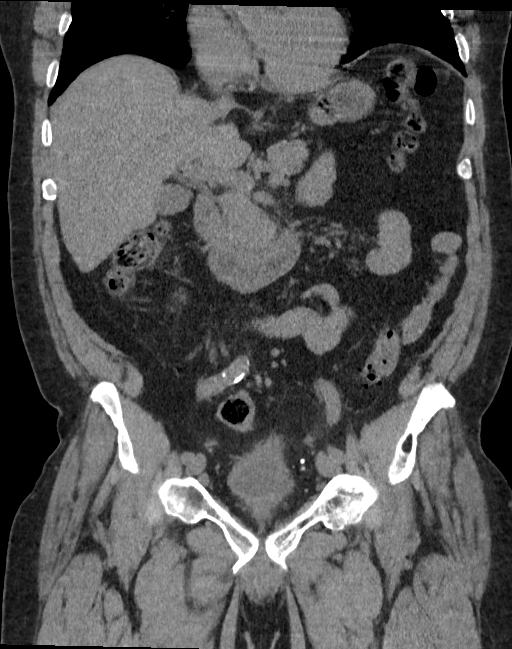
[im 94/170  soft-tissue]
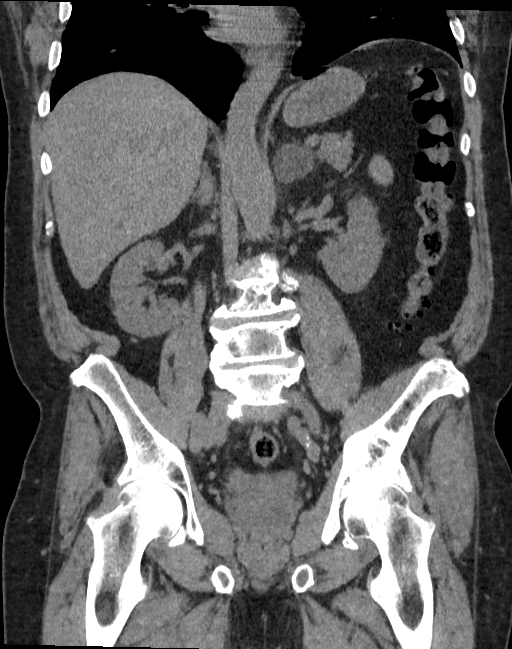

[15 of 46 positions shown; findings below may reference images not displayed]

FINDINGS: Lower chest: There is focal airspace filling opacity at the MEDIAL
RIGHT lung base, consistent minimal infiltrate. LEFT lung base is
unremarkable. There is elevation of LEFT hemidiaphragm. Heart size
is normal.

Hepatobiliary: No focal liver abnormality is seen. No radiopaque
gallstones, biliary dilatation, or pericholecystic inflammatory
changes.

Pancreas: Unremarkable. No pancreatic ductal dilatation or
surrounding inflammatory changes.

Spleen: Normal in size without focal abnormality.

Adrenals/Urinary Tract: A LEFT adrenal adenoma is 2.5 x
centimeters. RIGHT adrenal gland is normal in appearance. Kidneys
are unremarkable. Ureters are unremarkable. The bladder and
visualized portion of the urethra are normal.

Stomach/Bowel: The stomach is unremarkable. Small bowel loops are
normal in caliber and wall thickness. The appendix is well seen and
has a normal appearance. Moderate stool burden. There is significant
diverticulosis of the descending and sigmoid colon. No acute
inflammatory changes.

Vascular/Lymphatic: There is focal atherosclerotic calcification of
the abdominal aorta, not associated with aneurysm. No
retroperitoneal or mesenteric adenopathy.

Reproductive: The prostate gland appears enlarged. Inflammatory
changes are identified in the fat surrounding the prostate and
seminal vesicles. No evidence for fluid collection or contour
deforming mass. Findings are favored to represent acute prostatitis.

Other: Bilateral fat containing inguinal hernias are present.
Anterior abdominal wall is unremarkable. No ascites.

Musculoskeletal: Extensive degenerative changes in the LOWER lumbar
spine. Coarse trabeculae and increased sclerosis identified in the
LEFT iliac wing and LEFT pubis. There are degenerative changes in
the LEFT hip.
IMPRESSION: 1. Enlarged prostate gland with inflammatory changes in the fat
surrounding the prostate and seminal vesicles. Findings are favored
to represent prostatitis. No evidence for fluid collection or
contour deforming mass.
2. No urinary tract stones.
3. LEFT adrenal adenoma.
4. Colonic diverticulosis without acute inflammatory changes.
5. Bilateral fat containing inguinal hernias.
6. Degenerative changes in the LOWER lumbar spine and LEFT hip.
Probable Paget's of the LEFT hemipelvis.
7. Aortic Atherosclerosis ([id number]).

## 2023-09-10 ENCOUNTER — Emergency Department: Payer: Medicare HMO

## 2023-09-10 ENCOUNTER — Emergency Department
Admission: EM | Admit: 2023-09-10 | Discharge: 2023-09-10 | Disposition: A | Discharge status: Home / Self Care | Payer: Medicare HMO | Attending: Emergency Medicine | Admitting: Emergency Medicine

## 2023-09-10 ENCOUNTER — Encounter: Payer: Self-pay | Admitting: Emergency Medicine

## 2023-09-10 ENCOUNTER — Other Ambulatory Visit: Payer: Self-pay

## 2023-09-10 DIAGNOSIS — J101 Influenza due to other identified influenza virus with other respiratory manifestations: Secondary | ICD-10-CM | POA: Insufficient documentation

## 2023-09-10 DIAGNOSIS — Z20822 Contact with and (suspected) exposure to covid-19: Secondary | ICD-10-CM | POA: Diagnosis not present

## 2023-09-10 DIAGNOSIS — R519 Headache, unspecified: Secondary | ICD-10-CM | POA: Diagnosis present

## 2023-09-10 LAB — CBC WITH DIFFERENTIAL/PLATELET
Abs Immature Granulocytes: 0.04 10*3/uL (ref 0.00–0.07)
Basophils Absolute: 0 10*3/uL (ref 0.0–0.1)
Basophils Relative: 1 %
Eosinophils Absolute: 0.1 10*3/uL (ref 0.0–0.5)
Eosinophils Relative: 1 %
HCT: 47.2 % (ref 39.0–52.0)
Hemoglobin: 16.6 g/dL (ref 13.0–17.0)
Immature Granulocytes: 1 %
Lymphocytes Relative: 9 %
Lymphs Abs: 0.7 10*3/uL (ref 0.7–4.0)
MCH: 33.8 pg (ref 26.0–34.0)
MCHC: 35.2 g/dL (ref 30.0–36.0)
MCV: 96.1 fL (ref 80.0–100.0)
Monocytes Absolute: 1.2 10*3/uL — ABNORMAL HIGH (ref 0.1–1.0)
Monocytes Relative: 16 %
Neutro Abs: 5.6 10*3/uL (ref 1.7–7.7)
Neutrophils Relative %: 72 %
Platelets: 245 10*3/uL (ref 150–400)
RBC: 4.91 MIL/uL (ref 4.22–5.81)
RDW: 14.3 % (ref 11.5–15.5)
WBC: 7.7 10*3/uL (ref 4.0–10.5)
nRBC: 0 % (ref 0.0–0.2)

## 2023-09-10 LAB — COMPREHENSIVE METABOLIC PANEL
ALT: 36 U/L (ref 0–44)
AST: 59 U/L — ABNORMAL HIGH (ref 15–41)
Albumin: 3.9 g/dL (ref 3.5–5.0)
Alkaline Phosphatase: 100 U/L (ref 38–126)
Anion gap: 10 (ref 5–15)
BUN: 17 mg/dL (ref 8–23)
CO2: 22 mmol/L (ref 22–32)
Calcium: 9.1 mg/dL (ref 8.9–10.3)
Chloride: 103 mmol/L (ref 98–111)
Creatinine, Ser: 0.94 mg/dL (ref 0.61–1.24)
GFR, Estimated: >60 mL/min (ref >60)
Glucose, Bld: 93 mg/dL (ref 70–99)
Potassium: 3.7 mmol/L (ref 3.5–5.1)
Sodium: 135 mmol/L (ref 135–145)
Total Bilirubin: 0.7 mg/dL (ref 0.0–1.2)
Total Protein: 7.7 g/dL (ref 6.5–8.1)

## 2023-09-10 LAB — URINALYSIS, W/ REFLEX TO CULTURE (INFECTION SUSPECTED)
Bacteria, UA: NONE SEEN
Bilirubin Urine: NEGATIVE
Glucose, UA: NEGATIVE mg/dL
Ketones, ur: 5 mg/dL — AB
Leukocytes,Ua: NEGATIVE
Nitrite: NEGATIVE
Protein, ur: 30 mg/dL — AB
Specific Gravity, Urine: 1.02 (ref 1.005–1.030)
pH: 7 (ref 5.0–8.0)

## 2023-09-10 LAB — RESP PANEL BY RT-PCR (RSV, FLU A&B, COVID)  RVPGX2
Influenza A by PCR: POSITIVE — AB
Influenza B by PCR: NEGATIVE
Resp Syncytial Virus by PCR: NEGATIVE
SARS Coronavirus 2 by RT PCR: NEGATIVE

## 2023-09-10 LAB — LIPASE, BLOOD: Lipase: 29 U/L (ref 11–51)

## 2023-09-10 MED ORDER — OSELTAMIVIR PHOSPHATE 75 MG PO CAPS: 75.0000 mg | ORAL_CAPSULE | Freq: Two times a day (BID) | ORAL | 0 refills | Status: AC

## 2023-09-10 MED ORDER — SODIUM CHLORIDE 0.9 % IV BOLUS
1000.0000 mL | Freq: Once | INTRAVENOUS | Status: AC
  Administered 2023-09-10: 1000 mL via INTRAVENOUS

## 2023-09-10 MED ORDER — GUAIFENESIN-CODEINE 100-10 MG/5ML PO SOLN: 5.0000 mL | Freq: Four times a day (QID) | ORAL | 0 refills | Status: AC | PRN

## 2023-09-10 MED ORDER — KETOROLAC TROMETHAMINE 30 MG/ML IJ SOLN
15.0000 mg | Freq: Once | INTRAMUSCULAR | Status: AC
  Administered 2023-09-10: 15 mg via INTRAVENOUS
  Filled 2023-09-10: qty 1

## 2023-09-10 MED ORDER — FENTANYL CITRATE PF 50 MCG/ML IJ SOSY: 50.0000 ug | PREFILLED_SYRINGE | Freq: Once | INTRAMUSCULAR | Status: DC

## 2023-09-10 MED ORDER — ACETAMINOPHEN 500 MG PO TABS
1000.0000 mg | ORAL_TABLET | Freq: Once | ORAL | Status: AC
  Administered 2023-09-10: 1000 mg via ORAL
  Filled 2023-09-10: qty 2

## 2023-09-10 MED ORDER — CLONIDINE HCL 0.1 MG PO TABS
0.1000 mg | ORAL_TABLET | Freq: Once | ORAL | Status: AC
  Administered 2023-09-10: 0.1 mg via ORAL
  Filled 2023-09-10: qty 1

## 2023-09-10 MED ORDER — ONDANSETRON HCL 4 MG/2ML IJ SOLN
4.0000 mg | Freq: Once | INTRAMUSCULAR | Status: AC
  Administered 2023-09-10: 4 mg via INTRAVENOUS
  Filled 2023-09-10: qty 2

## 2023-09-10 NOTE — ED Triage Notes (Signed)
 Patient to ED via POV for abdominal pain and headache that started this AM. States he does have a slight cough. Denies N/V/D.

## 2023-09-10 NOTE — ED Provider Triage Note (Signed)
Emergency Medicine Provider Triage Evaluation Note  Mark Snyder. , a 73 y.o. male  was evaluated in triage.  Pt complains of chest pain and abdominal pain and headache. No N/V/D. +cough  Review of Systems  Positive: Abd pain, chest pain, headache, cough Negative: N/v/d  Physical Exam  There were no vitals taken for this visit. Gen:   Awake, no distress   Resp:  Normal effort  MSK:   Moves extremities without difficulty  Other:    Medical Decision Making  Medically screening exam initiated at 2:35 PM.  Appropriate orders placed.  Dorita Sciara. was informed that the remainder of the evaluation will be completed by another provider, this initial triage assessment does not replace that evaluation, and the importance of remaining in the ED until their evaluation is complete.     Jackelyn Hoehn, PA-C 09/10/23 1440

## 2023-09-10 NOTE — ED Provider Notes (Signed)
 [facility] Provider Note    Event Date/Time   First MD Initiated Contact with Patient 09/10/23 1856     (approximate)  History   Chief Complaint: Abdominal Pain  HPI  Mark Gere. is a 73 y.o. male who presents to the emergency department with complaints of headache cough body aches abdominal discomfort/nausea since yesterday.  According to the patient since yesterday afternoon he has been feeling somewhat weak and fatigued he has been feeling nauseated with some abdominal discomfort at times.  Patient has been coughing with a dry cough no sputum production and has been experiencing diffuse bodyaches.  No known fever although patient found to be febrile to 100.7 in the emergency department.  Physical Exam   Triage Vital Signs: ED Triage Vitals  Encounter Vitals Group     BP 09/10/23 1436 (!) 221/105     Systolic BP Percentile --      Diastolic BP Percentile --      Pulse Rate 09/10/23 1436 (!) 103     Resp 09/10/23 1436 18     Temp 09/10/23 1436 (!) 100.7 F (38.2 C)     Temp Source 09/10/23 1436 Oral     SpO2 09/10/23 1436 98 %     Weight 09/10/23 1437 230 lb (104.3 kg)     Height 09/10/23 1437 5\' 8"  (1.727 m)     Head Circumference --      Peak Flow --      Pain Score 09/10/23 1437 8     Pain Loc --      Pain Education --      Exclude from Growth Chart --     Most recent vital signs: Vitals:   09/10/23 1436 09/10/23 1438  BP: (!) 221/105 (!) 221/105  Pulse: (!) 103   Resp: 18   Temp: (!) 100.7 F (38.2 C)   SpO2: 98%     General: Awake, no distress.  CV:  Good peripheral perfusion.  Regular rate and rhythm  Resp:  Normal effort.  Equal breath sounds bilaterally.  Abd:  No distention.  Soft, nontender.  No rebound or guarding.  Benign abdomen.  ED Results / Procedures / Treatments   EKG  EKG viewed and interpreted by myself shows sinus tachycardia 101 bpm with a narrow QRS, normal axis, normal intervals, nonspecific but no  concerning ST changes.  RADIOLOGY  I have reviewed and interpreted chest x-ray images.  Elevated left hemidiaphragm but no consolidation on my evaluation. Radiology is read the x-ray is similar.   MEDICATIONS ORDERED IN ED: Medications  sodium chloride  0.9 % bolus 1,000 mL (has no administration in time range)  ketorolac  (TORADOL ) 30 MG/ML injection 15 mg (has no administration in time range)  ondansetron  (ZOFRAN ) injection 4 mg (has no administration in time range)  acetaminophen  (TYLENOL ) tablet 1,000 mg (has no administration in time range)     IMPRESSION / MDM / ASSESSMENT AND PLAN / ED COURSE  I reviewed the triage vital signs and the nursing notes.  Patient's presentation is most consistent with acute presentation with potential threat to life or bodily function.  Patient presents to the emergency department with complaints of nausea body aches abdominal discomfort headache cough since yesterday found to be febrile to 100.7 in the emergency department.  Overall patient appears well, reassuring physical exam, benign abdomen.  Patient's lab work shows a normal CBC usual normal white blood cell count, reassuring chemistry.  Lipase is normal.  Urinalysis is  normal.  Patient's respiratory panel has resulted positive for influenza A likely the cause of the patient's symptoms today.  As symptoms are started yesterday I did discuss pros and cons of Tamiflu , they would like to proceed with the prescription for Tamiflu .  Will also prescribe a cough medication in addition to supportive care Tylenol /ibuprofen and fluids.  We will dose IV fluids Toradol  and Zofran  while in the emergency department.  Patient is also quite hypertensive we will dose a one-time dose of 0.1 clonidine  as well as a gram of Tylenol  to help with the fever.  Patient agreeable to plan.  Patient is feeling much better.  Patient is ready to go home.  Workup reassuring blood pressure is downtrending.  Will prescribe Tamiflu  as  well as a codeine -based cough medication have the patient follow-up with his doctor.  Discussed my typical influenza return precautions.  FINAL CLINICAL IMPRESSION(S) / ED DIAGNOSES   Influenza A    Note:  This document was prepared using Dragon voice recognition software and may include unintentional dictation errors.   Ruth Cove, MD 09/10/23 2137

## 2023-12-22 ENCOUNTER — Other Ambulatory Visit: Payer: Self-pay

## 2023-12-22 ENCOUNTER — Emergency Department

## 2023-12-22 ENCOUNTER — Emergency Department
Admission: EM | Admit: 2023-12-22 | Discharge: 2023-12-22 | Disposition: A | Discharge status: Home / Self Care | Attending: Emergency Medicine | Admitting: Emergency Medicine

## 2023-12-22 ENCOUNTER — Encounter: Payer: Self-pay | Admitting: Intensive Care

## 2023-12-22 DIAGNOSIS — I10 Essential (primary) hypertension: Secondary | ICD-10-CM | POA: Insufficient documentation

## 2023-12-22 DIAGNOSIS — R0789 Other chest pain: Secondary | ICD-10-CM | POA: Insufficient documentation

## 2023-12-22 LAB — CBC
HCT: 46.7 % (ref 39.0–52.0)
Hemoglobin: 16.6 g/dL (ref 13.0–17.0)
MCH: 33.5 pg (ref 26.0–34.0)
MCHC: 35.5 g/dL (ref 30.0–36.0)
MCV: 94.3 fL (ref 80.0–100.0)
Platelets: 264 10*3/uL (ref 150–400)
RBC: 4.95 MIL/uL (ref 4.22–5.81)
RDW: 14.6 % (ref 11.5–15.5)
WBC: 6.4 10*3/uL (ref 4.0–10.5)
nRBC: 0 % (ref 0.0–0.2)

## 2023-12-22 LAB — BASIC METABOLIC PANEL WITH GFR
Anion gap: 11 (ref 5–15)
BUN: 12 mg/dL (ref 8–23)
CO2: 24 mmol/L (ref 22–32)
Calcium: 8.9 mg/dL (ref 8.9–10.3)
Chloride: 101 mmol/L (ref 98–111)
Creatinine, Ser: 0.8 mg/dL (ref 0.61–1.24)
GFR, Estimated: >60 mL/min (ref >60)
Glucose, Bld: 114 mg/dL — ABNORMAL HIGH (ref 70–99)
Potassium: 3.6 mmol/L (ref 3.5–5.1)
Sodium: 136 mmol/L (ref 135–145)

## 2023-12-22 LAB — TROPONIN I (HIGH SENSITIVITY)
Troponin I (High Sensitivity): 11 ng/L (ref <18)
Troponin I (High Sensitivity): 14 ng/L (ref <18)

## 2023-12-22 MED ORDER — LABETALOL HCL 5 MG/ML IV SOLN
10.0000 mg | Freq: Once | INTRAVENOUS | Status: AC
  Administered 2023-12-22: 10 mg via INTRAVENOUS
  Filled 2023-12-22: qty 4

## 2023-12-22 MED ORDER — HYDRALAZINE HCL 20 MG/ML IJ SOLN
5.0000 mg | Freq: Once | INTRAMUSCULAR | Status: AC
  Administered 2023-12-22: 5 mg via INTRAVENOUS
  Filled 2023-12-22: qty 1

## 2023-12-22 MED ORDER — AMLODIPINE BESYLATE 5 MG PO TABS: 5.0000 mg | ORAL_TABLET | Freq: Every day | ORAL | 2 refills | Status: AC

## 2023-12-22 NOTE — ED Provider Notes (Signed)
 [facility] Provider Note    Event Date/Time   First MD Initiated Contact with Patient 12/22/23 [phone]     (approximate)   History   Chest Pain   HPI  Mark Snyder. is a 73 y.o. male with a history of ureteral stones who presents with chest pain, acute onset, left-sided, sharp in quality.  It is nonradiating.  He denies any associated shortness of breath, weakness or lightheadedness, nausea or vomiting, or any leg swelling.  Denies any prior history of this pain.  He states that it is somewhat intermittent and is not related to exertion.  I reviewed the past medical records.  The patient was seen in the ED on 1/15 of this year for influenza.  His only documented outpatient encounter was in 2021 with urology for follow-up of kidney stone and prostatitis.   Physical Exam   Triage Vital Signs: ED Triage Vitals  Encounter Vitals Group     BP 12/22/23 0830 (!) 236/114     Systolic BP Percentile --      Diastolic BP Percentile --      Pulse Rate 12/22/23 0830 82     Resp 12/22/23 0830 18     Temp 12/22/23 0830 97.8 F (36.6 C)     Temp Source 12/22/23 0830 Oral     SpO2 12/22/23 0830 96 %     Weight 12/22/23 0825 205 lb 8 oz (93.2 kg)     Height 12/22/23 0828 5\' 8"  (1.727 m)     Head Circumference --      Peak Flow --      Pain Score 12/22/23 0827 7     Pain Loc --      Pain Education --      Exclude from Growth Chart --     Most recent vital signs: Vitals:   12/22/23 1148 12/22/23 1200  BP:  (!) 221/95  Pulse: 60 64  Resp: 17 (!) 21  Temp:    SpO2: 94% 95%     General: Awake, no distress.  CV:  Good peripheral perfusion. Normal heart sounds. Resp:  Normal effort.  Lungs CTAB.   Abd:  No distention. Other:  No calf or popliteal swelling or tenderness.  No peripheral edema.   ED Results / Procedures / Treatments   Labs (all labs ordered are listed, but only abnormal results are displayed) Labs Reviewed  BASIC METABOLIC PANEL WITH  GFR - Abnormal; Notable for the following components:      Result Value   Glucose, Bld 114 (*)    All other components within normal limits  CBC  TROPONIN I (HIGH SENSITIVITY)  TROPONIN I (HIGH SENSITIVITY)     EKG  ED ECG REPORT I, Lind Repine, the attending physician, personally viewed and interpreted this ECG.  Date: 12/22/2023 EKG Time: 0850 Rate: 82 Rhythm: normal sinus rhythm QRS Axis: normal Intervals: normal ST/T Wave abnormalities: LVH Narrative Interpretation: no evidence of acute ischemia    RADIOLOGY  Chest x-ray: I independently viewed and interpreted the images; there is no focal consolidation or edema  PROCEDURES:  Critical Care performed: No  Procedures   MEDICATIONS ORDERED IN ED: Medications  labetalol (NORMODYNE) injection 10 mg (10 mg Intravenous Given 12/22/23 0921)  hydrALAZINE (APRESOLINE) injection 5 mg (5 mg Intravenous Given 12/22/23 1059)     IMPRESSION / MDM / ASSESSMENT AND PLAN / ED COURSE  I reviewed the triage vital signs and the nursing notes.  73 year old male  with PMH as noted above presents with atypical nonexertional chest pain since this morning.  On exam the patient is significantly hypertensive.  Other vital signs are normal.  Physical exam is otherwise unremarkable for acute findings.  EKG shows LVH with no evidence of ischemia.  Differential diagnosis includes, but is not limited to, ACS, musculoskeletal pain, GERD, hypertensive crisis.  There is no clinical evidence for PE.  The patient has no tachycardia or hypoxia and the pain is somewhat intermittent.  He has no DVT symptoms.  There is no evidence of aortic dissection or other vascular etiology.  The patient is overall quite well-appearing and relatively comfortable.  I have ordered lab workup, chest x-ray, and IV labetalol for blood pressure control.  Patient's presentation is most consistent with acute presentation with potential threat to life or bodily  function.  The patient is on the cardiac monitor to evaluate for evidence of arrhythmia and/or significant heart rate changes.  ----------------------------------------- 12:00 PM on 12/22/2023 -----------------------------------------  The patient's chest pain has resolved.  He feels well and would like to go home.  Troponins are negative x 2.  Other lab workup is reassuring; the creatinine is normal.  Chest x-ray shows no acute findings.  Blood pressure has improved somewhat after labetalol and hydralazine.  It is still elevated, however at this point given the resolved symptoms and negative workup, this is asymptomatic hypertension and does not warrant further emergent treatment.  There is no evidence of hypertensive emergency.  I suspect that the patient has had poorly controlled hypertension for some time.  She states his last primary care visit was several years ago.  He states he can follow-up with his primary care doctor.  In the meantime, I am starting him on amlodipine.  I counseled him on the results of the workup and plan of care.  I gave strict return precautions and the patient expresses understanding.   FINAL CLINICAL IMPRESSION(S) / ED DIAGNOSES   Final diagnoses:  Uncontrolled hypertension  Atypical chest pain     Rx / DC Orders   ED Discharge Orders          Ordered    amLODipine (NORVASC) 5 MG tablet  Daily        12/22/23 1159             Note:  This document was prepared using Dragon voice recognition software and may include unintentional dictation errors.    Lind Repine, MD 12/22/23 1534

## 2023-12-22 NOTE — ED Notes (Signed)
Pt ambulatory to restroom at this time independently.

## 2023-12-22 NOTE — Discharge Instructions (Addendum)
 Make an appointment to follow-up with your primary care doctor as soon as possible.  Start taking the amlodipine that was prescribed.  You should take this daily.  We have prescribed a 1 month supply with 2 refills.  You should continue until you follow-up with the primary care doctor, which time they may change the dose, add something, or continue on this medicine.  Return to the ER for new, worsening, or persistent severe chest pain, shortness of breath, dizziness, severe headache, or persistent high blood pressure readings especially over 200 on the top number or 120 on the bottom number.

## 2023-12-22 NOTE — ED Triage Notes (Signed)
 C/o left sided sharp chest pain. Denies radiation

## 2023-12-22 NOTE — ED Notes (Signed)
 Pt to xray
